# Patient Record
Sex: Male | Born: 1980 | Race: Black or African American | Hispanic: No | Smoking: Current every day smoker
Health system: Southern US, Community
[De-identification: ages and names within clinical notes are randomized; demographics above are authoritative.]

## PROBLEM LIST (undated history)

## (undated) HISTORY — PX: KNEE SURGERY: SHX244

## (undated) HISTORY — PX: HAND SURGERY: SHX662

---

## 2019-03-21 ENCOUNTER — Emergency Department: Admission: EM | Admit: 2019-03-21 | Discharge: 2019-03-21 | Discharge status: Against Medical Advice | Payer: Self-pay

## 2019-03-21 NOTE — ED Notes (Signed)
Patient refusing triage at this time due to no visitor policy.  Patient reassured he would be able to stay in contact with visitor but declined.  Visitor reports she will attempt to talk to patient and try to get him to be seen.

## 2019-03-22 ENCOUNTER — Emergency Department: Payer: Medicaid - Out of State

## 2019-03-22 ENCOUNTER — Emergency Department
Admission: EM | Admit: 2019-03-22 | Discharge: 2019-03-22 | Disposition: A | Discharge status: Home / Self Care | Payer: Medicaid - Out of State | Attending: Emergency Medicine | Admitting: Emergency Medicine

## 2019-03-22 ENCOUNTER — Encounter: Payer: Self-pay | Admitting: Emergency Medicine

## 2019-03-22 ENCOUNTER — Other Ambulatory Visit: Payer: Self-pay

## 2019-03-22 DIAGNOSIS — F17210 Nicotine dependence, cigarettes, uncomplicated: Secondary | ICD-10-CM | POA: Diagnosis not present

## 2019-03-22 DIAGNOSIS — R079 Chest pain, unspecified: Secondary | ICD-10-CM | POA: Insufficient documentation

## 2019-03-22 LAB — CBC
HCT: 51.2 % (ref 39.0–52.0)
Hemoglobin: 17.1 g/dL — ABNORMAL HIGH (ref 13.0–17.0)
MCH: 29.3 pg (ref 26.0–34.0)
MCHC: 33.4 g/dL (ref 30.0–36.0)
MCV: 87.8 fL (ref 80.0–100.0)
Platelets: 206 10*3/uL (ref 150–400)
RBC: 5.83 MIL/uL — ABNORMAL HIGH (ref 4.22–5.81)
RDW: 13.9 % (ref 11.5–15.5)
WBC: 15.2 10*3/uL — ABNORMAL HIGH (ref 4.0–10.5)
nRBC: 0 % (ref 0.0–0.2)

## 2019-03-22 LAB — BASIC METABOLIC PANEL
Anion gap: 10 (ref 5–15)
BUN: 12 mg/dL (ref 6–20)
CO2: 24 mmol/L (ref 22–32)
Calcium: 9.3 mg/dL (ref 8.9–10.3)
Chloride: 103 mmol/L (ref 98–111)
Creatinine, Ser: 0.93 mg/dL (ref 0.61–1.24)
GFR calc Af Amer: >60 mL/min (ref >60)
GFR calc non Af Amer: >60 mL/min (ref >60)
Glucose, Bld: 173 mg/dL — ABNORMAL HIGH (ref 70–99)
Potassium: 3.9 mmol/L (ref 3.5–5.1)
Sodium: 137 mmol/L (ref 135–145)

## 2019-03-22 LAB — TROPONIN I (HIGH SENSITIVITY): Troponin I (High Sensitivity): <2 ng/L (ref <18)

## 2019-03-22 MED ORDER — KETOROLAC TROMETHAMINE 30 MG/ML IJ SOLN
30.0000 mg | Freq: Once | INTRAMUSCULAR | Status: AC
  Administered 2019-03-22: 18:00:00 30 mg via INTRAMUSCULAR
  Filled 2019-03-22: qty 1

## 2019-03-22 MED ORDER — ACETAMINOPHEN 325 MG PO TABS
650.0000 mg | ORAL_TABLET | Freq: Once | ORAL | Status: AC
  Administered 2019-03-22: 650 mg via ORAL
  Filled 2019-03-22: qty 2

## 2019-03-22 MED ORDER — METHOCARBAMOL 500 MG PO TABS: 500.0000 mg | ORAL_TABLET | Freq: Four times a day (QID) | ORAL | 0 refills | Status: AC

## 2019-03-22 MED ORDER — MELOXICAM 15 MG PO TABS: 15.0000 mg | ORAL_TABLET | Freq: Every day | ORAL | 0 refills | Status: AC

## 2019-03-22 MED ORDER — PREDNISONE 50 MG PO TABS: 50.0000 mg | ORAL_TABLET | Freq: Every day | ORAL | 0 refills | Status: AC

## 2019-03-22 MED ORDER — CYCLOBENZAPRINE HCL 10 MG PO TABS
5.0000 mg | ORAL_TABLET | Freq: Once | ORAL | Status: AC
  Administered 2019-03-22: 5 mg via ORAL
  Filled 2019-03-22: qty 1

## 2019-03-22 NOTE — ED Provider Notes (Signed)
[facility] Emergency Department Provider Note  ____________________________________________  Time seen: Approximately 4:30 PM  I have reviewed the triage vital signs and the nursing notes.   HISTORY  Chief Complaint Chest Pain     HPI Tyler Patel is a 38 y.o. male who presents the emergency department for complaint of left-sided chest pain.  Patient reports that the pain began last night.  It was sharp, stabbing, worse with movement and palpation.  Patient reports that he laid down, symptoms went away until he woke this morning.  Patient reports that is been intermittent throughout today.  He denies any shortness of breath, radiation of pain, numbness and tingling in his left arm, trauma to the chest.  Patient has not tried medications prior to arrival.  He denies any URI symptoms or fevers or chills, nasal congestion, sore throat, cough.  No COVID-19 contacts.         History reviewed. No pertinent past medical history.  There are no active problems to display for this patient.   Past Surgical History:  Procedure Laterality Date  . HAND SURGERY Right   . KNEE SURGERY      Prior to Admission medications   Not on File    Allergies Patient has no known allergies.  History reviewed. No pertinent family history.  Social History Social History   Tobacco Use  . Smoking status: Current Every Day Smoker    Packs/day: 1.00    Types: Cigarettes  . Smokeless tobacco: Never Used  Substance Use Topics  . Alcohol use: Not on file  . Drug use: Not on file     Review of Systems  Constitutional: No fever/chills Eyes: No visual changes. No discharge ENT: No upper respiratory complaints. Cardiovascular: Positive for left-sided chest pain Respiratory: no cough. No SOB. Gastrointestinal: No abdominal pain.  No nausea, no vomiting.   Musculoskeletal: Negative for musculoskeletal pain. Skin: Negative for rash, abrasions, lacerations,  ecchymosis. Neurological: Negative for headaches, focal weakness or numbness. 10-point ROS otherwise negative.  ____________________________________________   PHYSICAL EXAM:  VITAL SIGNS: ED Triage Vitals  Enc Vitals Group     BP 03/22/19 1435 134/85     Pulse Rate 03/22/19 1435 98     Resp 03/22/19 1435 20     Temp 03/22/19 1435 99.1 F (37.3 C)     Temp Source 03/22/19 1435 Oral     SpO2 03/22/19 1435 98 %     Weight 03/22/19 1435 175 lb (79.4 kg)     Height 03/22/19 1435 6' (1.829 m)     Head Circumference --      Peak Flow --      Pain Score 03/22/19 1442 6     Pain Loc --      Pain Edu? --      Excl. in GC? --      Constitutional: Alert and oriented. Well appearing and in no acute distress. Eyes: Conjunctivae are normal. PERRL. EOMI. Head: Atraumatic. Neck: No stridor.  No cervical spine tenderness to palpation Hematological/Lymphatic/Immunilogical: No cervical lymphadenopathy. Cardiovascular: Normal rate, regular rhythm. Normal S1 and S2.  No murmurs, rubs, gallops.  Good peripheral circulation. Respiratory: Normal respiratory effort without tachypnea or retractions. Lungs CTAB. Good air entry to the bases with no decreased or absent breath sounds. Gastrointestinal: Bowel sounds 4 quadrants. Soft and nontender to palpation. No guarding or rigidity. No palpable masses. No distention. No CVA tenderness Musculoskeletal: Full range of motion to all extremities. No gross deformities appreciated.  Visualization of the external chest wall reveals no visible signs of trauma.  Patient is very tender to palpation along ribs 3 through 7 the left anterolateral chest.  No palpable abnormality or step-off.  No subcutaneous emphysema.  Good underlying breath sounds bilaterally.  Palpation of the chest reproduces pain. Neurologic:  Normal speech and language. No gross focal neurologic deficits are appreciated.  Skin:  Skin is warm, dry and intact. No rash noted. Psychiatric: Mood and  affect are normal. Speech and behavior are normal. Patient exhibits appropriate insight and judgement.   ____________________________________________   LABS (all labs ordered are listed, but only abnormal results are displayed)  Labs Reviewed  BASIC METABOLIC PANEL - Abnormal; Notable for the following components:      Result Value   Glucose, Bld 173 (*)    All other components within normal limits  CBC - Abnormal; Notable for the following components:   WBC 15.2 (*)    RBC 5.83 (*)    Hemoglobin 17.1 (*)    All other components within normal limits  TROPONIN I (HIGH SENSITIVITY)  TROPONIN I (HIGH SENSITIVITY)   ____________________________________________  EKG   ____________________________________________  RADIOLOGY I personally viewed and evaluated these images as part of my medical decision making, as well as reviewing the written report by the radiologist.  No results found.  ____________________________________________    PROCEDURES  Procedure(s) performed:    Procedures    Medications - No data to display   ____________________________________________   INITIAL IMPRESSION / ASSESSMENT AND PLAN / ED COURSE  Pertinent labs & imaging results that were available during my care of the patient were reviewed by me and considered in my medical decision making (see chart for details).  Review of the [place] CSRS was performed in accordance of the [place] prior to dispensing any controlled drugs.           Patient's diagnosis is consistent with nonspecific chest pain/chest wall pain.  Patient presented to the emergency department with left-sided chest pain that began last night.  It has been intermittent in nature.  It is reproducible with palpation.  No URI symptoms.  No cardiac history.  Exam is reassuring.  Labs and imaging was reassuring.  With reproducible pain, I suspect that this is musculoskeletal in nature.  Patient will be given anti-inflammatory,  steroid, muscle relaxer.  Follow-up with primary care as needed..  Patient is given ED precautions to return to the ED for any worsening or new symptoms.     ____________________________________________  FINAL CLINICAL IMPRESSION(S) / ED DIAGNOSES  Final diagnoses:  None      NEW MEDICATIONS STARTED DURING THIS VISIT:  ED Discharge Orders    None          This chart was dictated using voice recognition software/Dragon. Despite best efforts to proofread, errors can occur which can change the meaning. Any change was purely unintentional.    Darletta Moll, PA-C 03/22/19 Amada Kingfisher, MD 03/23/19 1316

## 2019-03-22 NOTE — ED Triage Notes (Signed)
Pt arrived via POV with reports of left side chest pain that started last night, pt states the pain started on the left side of the chest and left side of the neck.  Pt reports the pain as sharp and 6/10.  Denies any shortness of breath, reports pain if he tries to cough. No known covid contacts.

## 2020-03-11 IMAGING — CR CHEST - 2 VIEW
1 series · 2 of 2 positions shown · non-contrast
Comparison: None.

CLINICAL DATA: 37-year-old presenting with acute onset of
LEFT-sided chest pain that began last night. Current smoker.

EXAM:
CHEST - 2 VIEW

[Series 1: dg chest 2 view · 0.14mm/px · 2 of 2 slices shown]
[im 1/2]
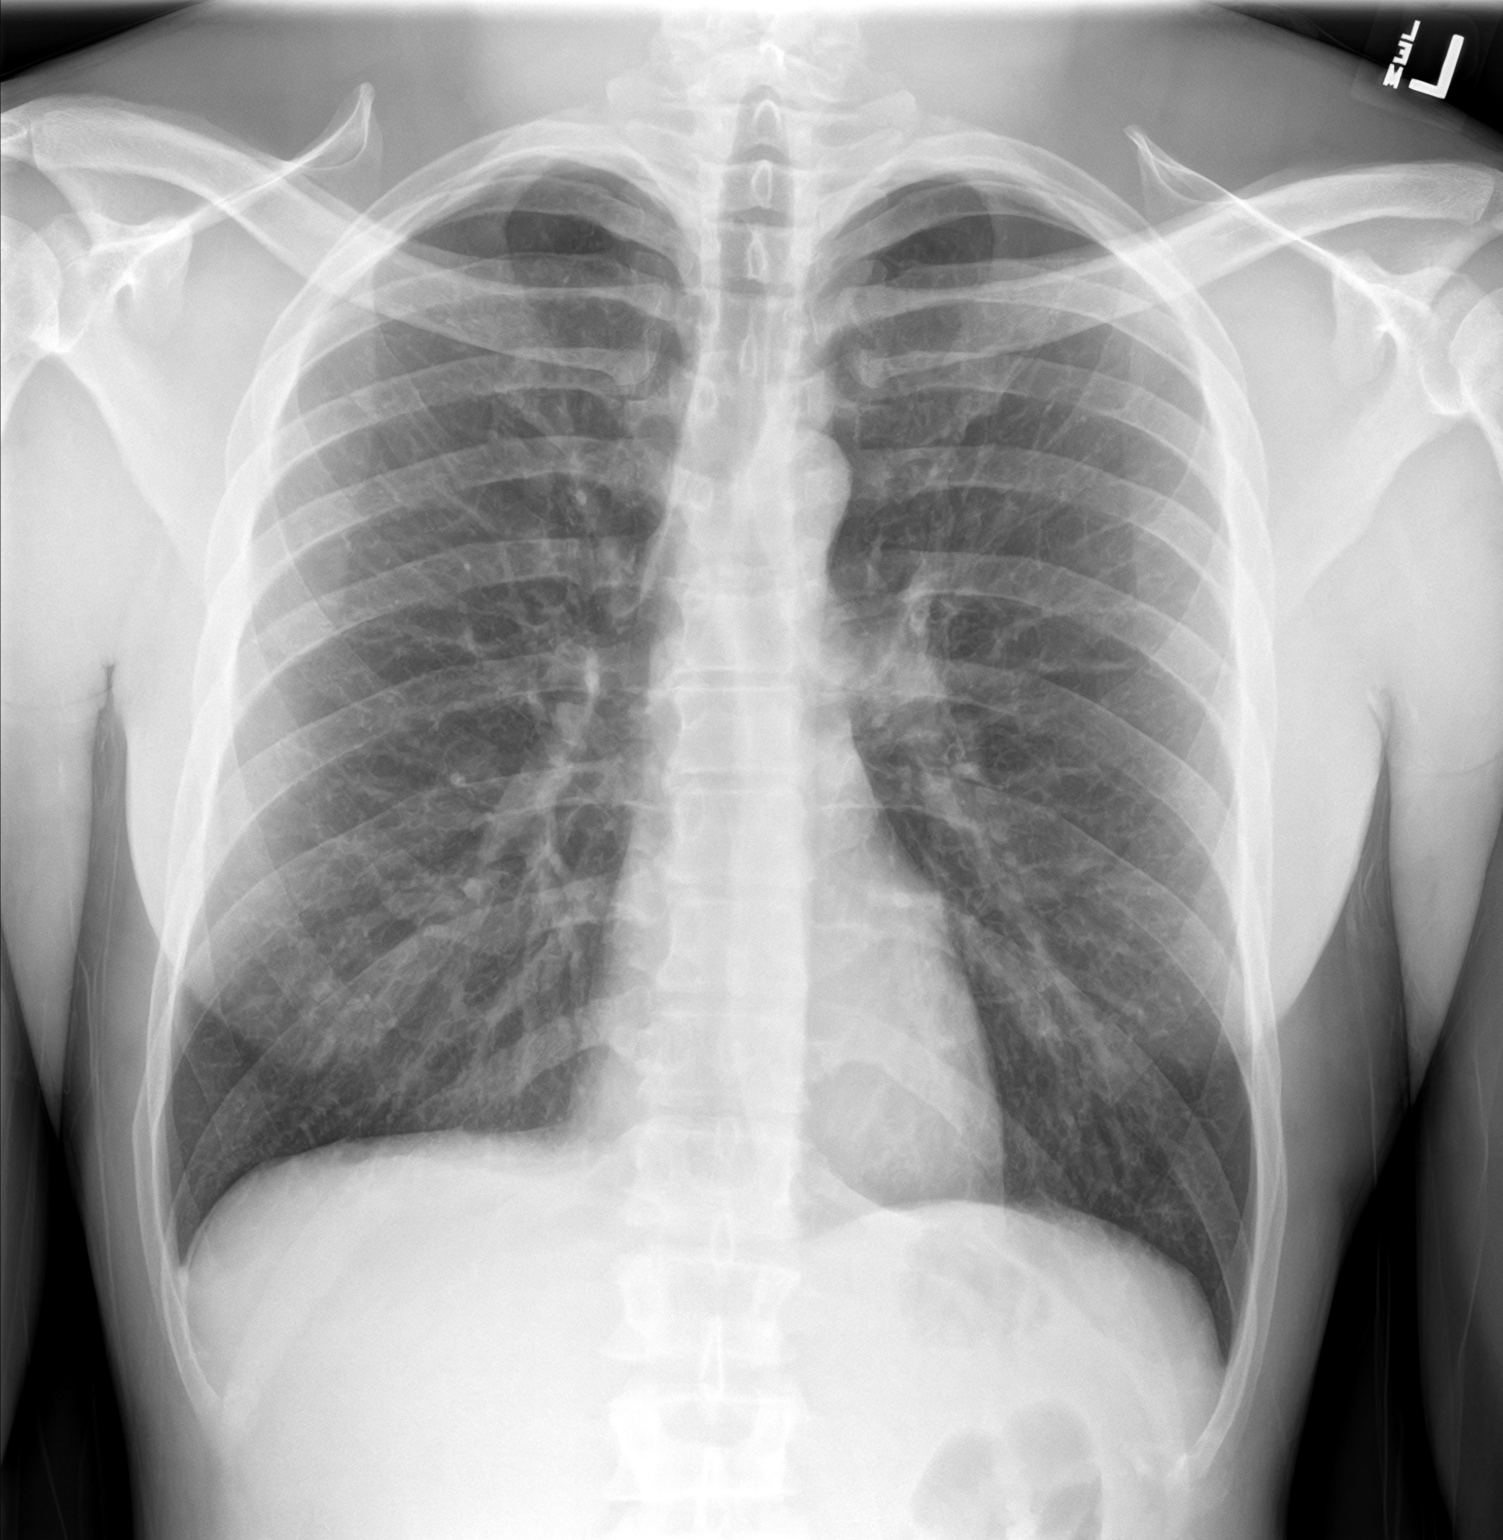
[im 2/2]
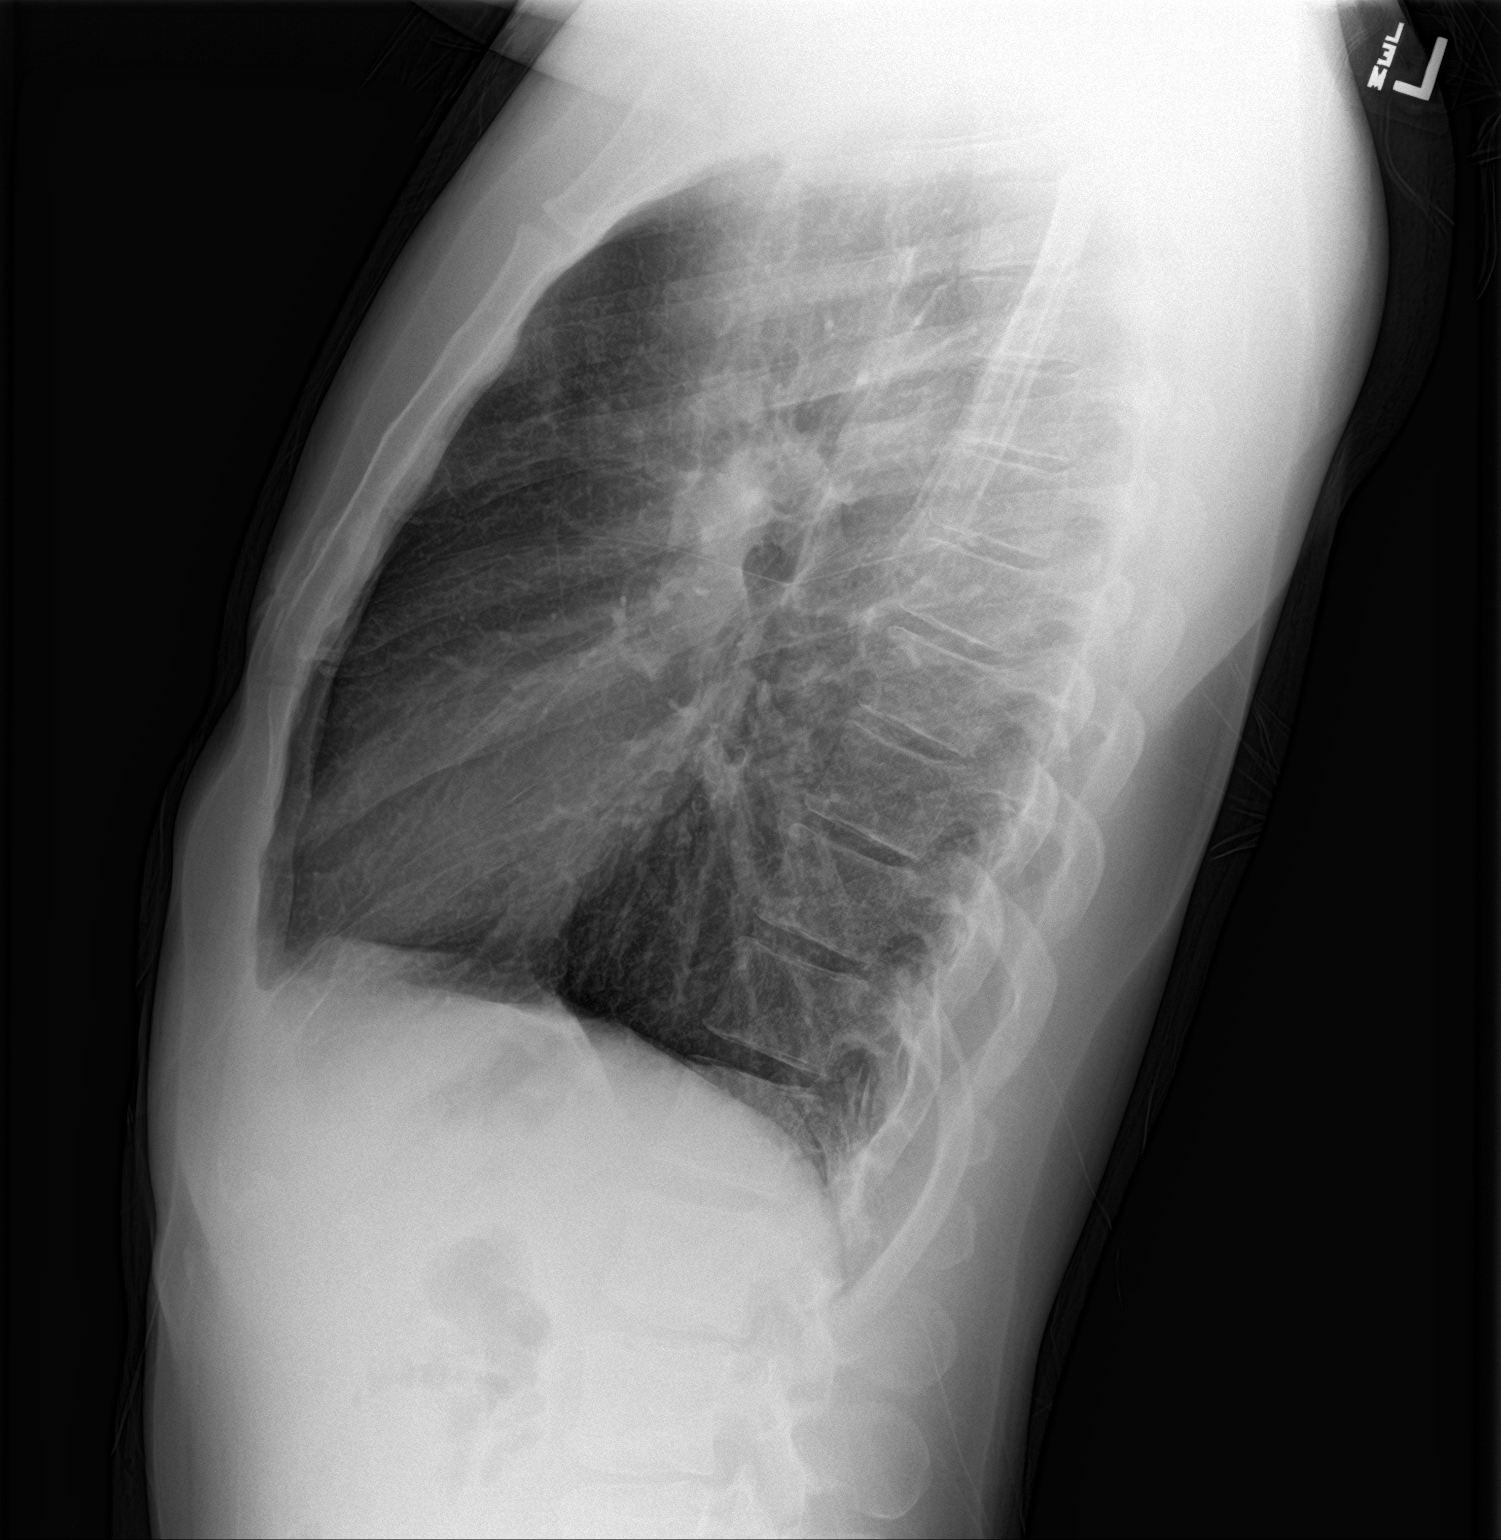

[2 of 2 positions shown; findings below may reference images not displayed]

FINDINGS: Cardiomediastinal silhouette unremarkable. Mildly prominent
bronchovascular markings diffusely and minimal central peribronchial
thickening. Lungs otherwise clear. No localized airspace
consolidation. No pleural effusions. No pneumothorax. Normal
pulmonary vascularity. Visualized bony thorax intact.
IMPRESSION: Minimal changes of bronchitis and/or asthma without focal airspace
pneumonia.
